# Patient Record
Sex: Female | Born: 1990 | Race: White | Hispanic: Yes | Marital: Single | Smoking: Never smoker
Health system: Southern US, Community
[De-identification: ages and names within clinical notes are randomized; demographics above are authoritative.]

## PROBLEM LIST (undated history)

## (undated) DIAGNOSIS — M1990 Unspecified osteoarthritis, unspecified site: Secondary | ICD-10-CM

---
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
## 2010-05-06 LAB — BASIC METABOLIC PANEL
CO2: 22 mEq/L (ref 19–32)
Calcium: 8 mg/dL — ABNORMAL LOW (ref 8.4–10.5)
Potassium: 3.8 mEq/L (ref 3.5–5.1)
Sodium: 135 mEq/L (ref 135–145)

## 2010-05-06 LAB — COMPREHENSIVE METABOLIC PANEL
ALT: 23 U/L (ref 0–35)
AST: 39 U/L — ABNORMAL HIGH (ref 0–37)
CO2: 20 mEq/L (ref 19–32)
Chloride: 103 mEq/L (ref 96–112)
Creatinine, Ser: 1.32 mg/dL — ABNORMAL HIGH (ref 0.4–1.2)
GFR calc Af Amer: >60 mL/min (ref >60)
GFR calc non Af Amer: 52 mL/min — ABNORMAL LOW (ref >60)
Total Bilirubin: 0.8 mg/dL (ref 0.3–1.2)

## 2010-05-06 LAB — MAGNESIUM: Magnesium: 1.8 mg/dL (ref 1.5–2.5)

## 2010-05-06 LAB — DIFFERENTIAL
Basophils Relative: 0 % (ref 0–1)
Eosinophils Absolute: 0 10*3/uL (ref 0.0–0.7)
Neutrophils Relative %: 70 % (ref 43–77)

## 2010-05-06 LAB — T4, FREE: Free T4: 1.06 ng/dL (ref 0.80–1.80)

## 2010-05-06 LAB — URINALYSIS, ROUTINE W REFLEX MICROSCOPIC
Glucose, UA: NEGATIVE mg/dL
Protein, ur: 100 mg/dL — AB
Specific Gravity, Urine: 1.025 (ref 1.005–1.030)
Urobilinogen, UA: 0.2 mg/dL (ref 0.0–1.0)
pH: 5.5 (ref 5.0–8.0)

## 2010-05-06 LAB — URINE CULTURE: Special Requests: NEGATIVE

## 2010-05-06 LAB — CBC
Hemoglobin: 12.5 g/dL (ref 12.0–15.0)
MCHC: 33.4 g/dL (ref 30.0–36.0)
Platelets: 178 10*3/uL (ref 150–400)
RBC: 3.63 MIL/uL — ABNORMAL LOW (ref 3.87–5.11)
RDW: 14.5 % (ref 11.5–15.5)
RDW: 15.2 % (ref 11.5–15.5)

## 2010-05-06 LAB — RAPID URINE DRUG SCREEN, HOSP PERFORMED
Amphetamines: NOT DETECTED
Opiates: NOT DETECTED

## 2010-05-06 LAB — URINE MICROSCOPIC-ADD ON

## 2010-05-06 LAB — HEMOGLOBIN A1C
Hgb A1c MFr Bld: 5.8 % (ref 4.6–6.1)
Mean Plasma Glucose: 120 mg/dL

## 2010-05-06 LAB — TROPONIN I
Troponin I: 0.08 ng/mL — ABNORMAL HIGH (ref 0.00–0.06)
Troponin I: 0.43 ng/mL — ABNORMAL HIGH (ref 0.00–0.06)

## 2010-05-06 LAB — CULTURE, BLOOD (ROUTINE X 2): Culture: NO GROWTH

## 2010-05-06 LAB — TSH: TSH: 4.687 u[IU]/mL — ABNORMAL HIGH (ref 0.700–6.400)

## 2010-05-06 LAB — CK TOTAL AND CKMB (NOT AT ARMC)
CK, MB: 0.9 ng/mL (ref 0.3–4.0)
Relative Index: INVALID (ref 0.0–2.5)

## 2010-05-06 LAB — LACTIC ACID, PLASMA: Lactic Acid, Venous: 1.3 mmol/L (ref 0.5–2.2)

## 2010-05-06 LAB — PROTIME-INR: Prothrombin Time: 15.6 seconds — ABNORMAL HIGH (ref 11.6–15.2)

## 2010-05-09 LAB — CBC
Hemoglobin: 10.4 g/dL — ABNORMAL LOW (ref 12.0–15.0)
RBC: 3.68 MIL/uL — ABNORMAL LOW (ref 3.87–5.11)
WBC: 6.4 10*3/uL (ref 4.0–10.5)

## 2010-05-09 LAB — BASIC METABOLIC PANEL
Calcium: 8.3 mg/dL — ABNORMAL LOW (ref 8.4–10.5)
Creatinine, Ser: 0.49 mg/dL (ref 0.4–1.2)
GFR calc Af Amer: >60 mL/min (ref >60)
Sodium: 135 mEq/L (ref 135–145)

## 2010-06-19 IMAGING — CR DG CHEST 1V PORT
1 series · 1 of 1 positions shown · non-contrast
Comparison: Portable exam 8688 hours without priors for comparison.

CLINICAL DATA: Arrhythmia, fever

PORTABLE CHEST - 1 VIEW

[view not recorded]
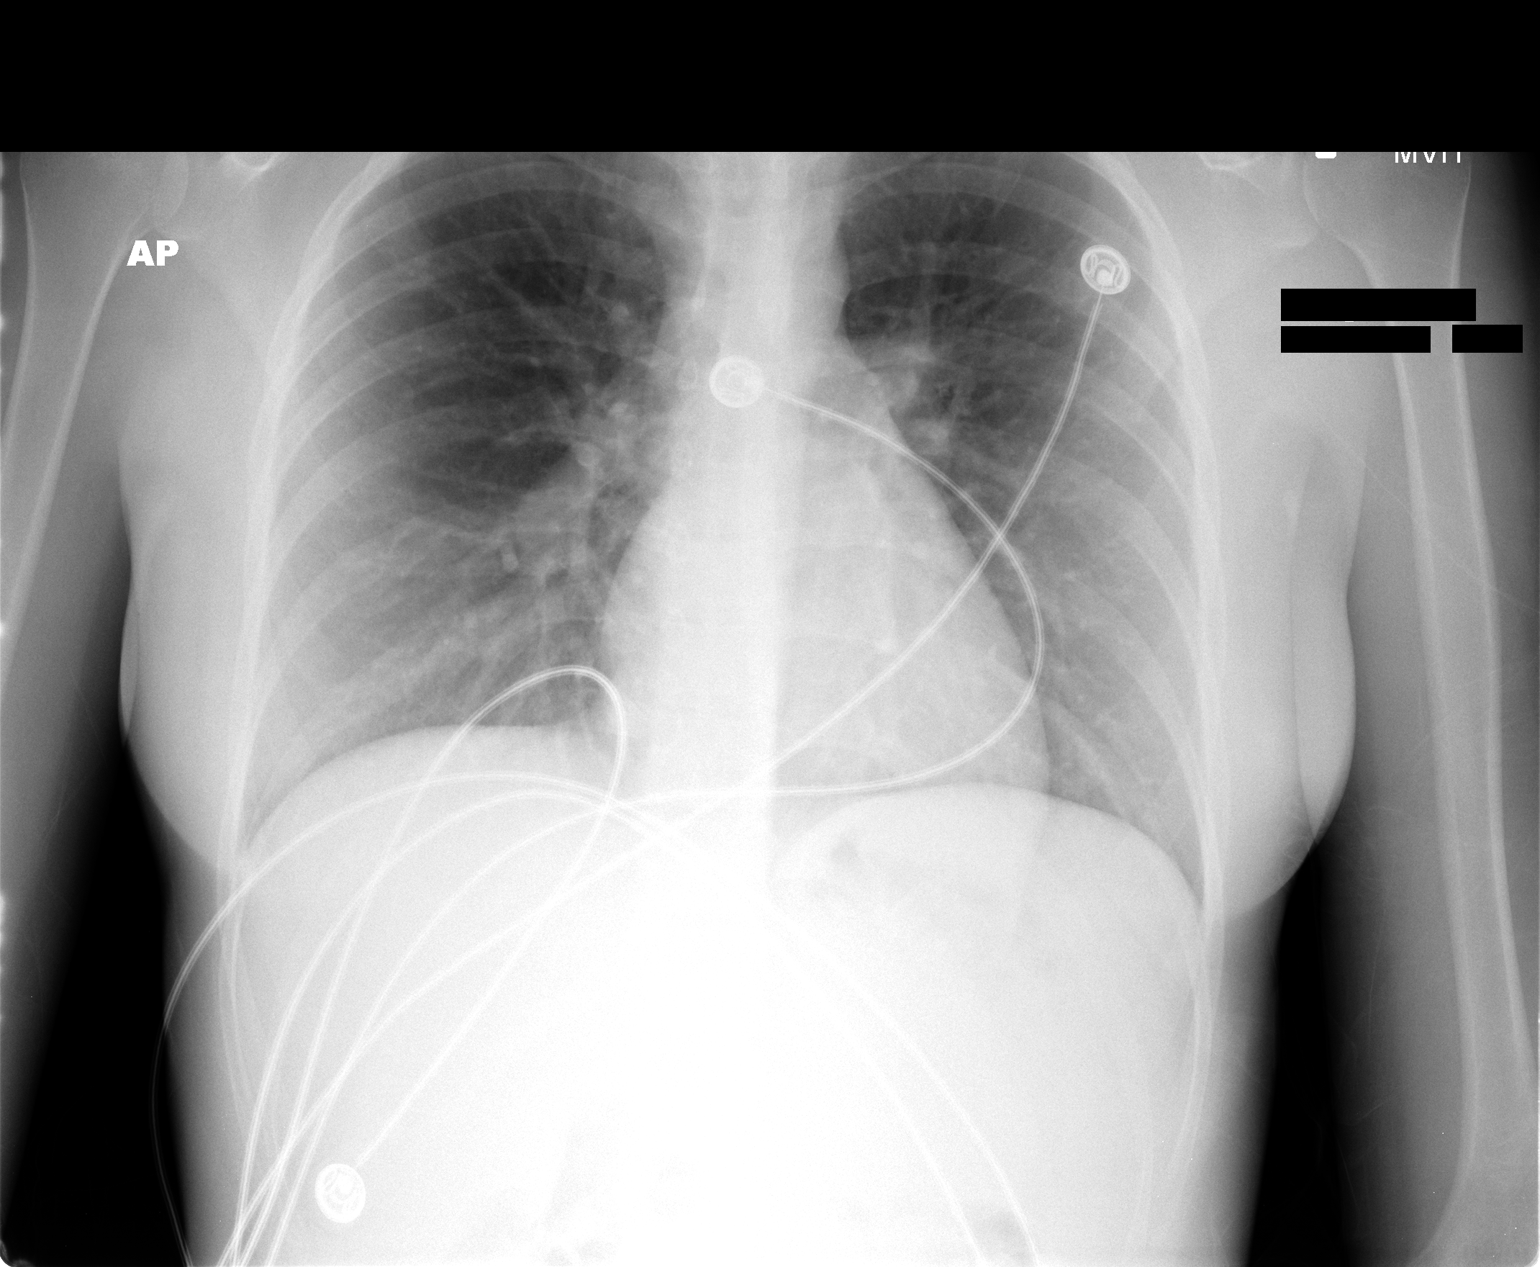

[1 of 1 positions shown; findings below may reference images not displayed]

FINDINGS: Normal heart size, mediastinal contours, and pulmonary vascularity
for technique.
Lungs clear.
Cardiac monitoring lines project over chest.
No pleural effusion or pneumothorax.
IMPRESSION: No acute abnormalities.

## 2010-11-23 ENCOUNTER — Emergency Department (HOSPITAL_COMMUNITY)
Admission: EM | Admit: 2010-11-23 | Discharge: 2010-11-23 | Discharge status: Against Medical Advice | Payer: BC Managed Care – PPO | Attending: Emergency Medicine | Admitting: Emergency Medicine

## 2010-11-23 DIAGNOSIS — Z0389 Encounter for observation for other suspected diseases and conditions ruled out: Secondary | ICD-10-CM | POA: Insufficient documentation

## 2013-07-06 ENCOUNTER — Emergency Department (HOSPITAL_COMMUNITY)
Admission: EM | Admit: 2013-07-06 | Discharge: 2013-07-06 | Disposition: A | Discharge status: Home / Self Care | Payer: BC Managed Care – PPO | Source: Home / Self Care

## 2013-07-06 ENCOUNTER — Encounter (HOSPITAL_COMMUNITY): Payer: Self-pay | Admitting: Emergency Medicine

## 2013-07-06 DIAGNOSIS — R5381 Other malaise: Secondary | ICD-10-CM

## 2013-07-06 DIAGNOSIS — R5383 Other fatigue: Secondary | ICD-10-CM

## 2013-07-06 DIAGNOSIS — K589 Irritable bowel syndrome without diarrhea: Secondary | ICD-10-CM

## 2013-07-06 HISTORY — DX: Unspecified osteoarthritis, unspecified site: M19.90

## 2013-07-06 LAB — POCT URINALYSIS DIP (DEVICE)
Bilirubin Urine: NEGATIVE
Glucose, UA: NEGATIVE mg/dL
Hgb urine dipstick: NEGATIVE
Ketones, ur: NEGATIVE mg/dL
LEUKOCYTES UA: NEGATIVE
NITRITE: NEGATIVE
PH: 7.5 (ref 5.0–8.0)
PROTEIN: NEGATIVE mg/dL
Specific Gravity, Urine: 1.025 (ref 1.005–1.030)
UROBILINOGEN UA: 0.2 mg/dL (ref 0.0–1.0)

## 2013-07-06 NOTE — Discharge Instructions (Signed)
Diet and Irritable Bowel Syndrome  °No cure has been found for irritable bowel syndrome (IBS). Many options are available to treat the symptoms. Your caregiver will give you the best treatments available for your symptoms. He or she will also encourage you to manage stress and to make changes to your diet. You need to work with your caregiver and Registered Dietician to find the best combination of medicine, diet, counseling, and support to control your symptoms. The following are some diet suggestions. °FOODS THAT MAKE IBS WORSE °· Fatty foods, such as French fries. °· Milk products, such as cheese or ice cream. °· Chocolate. °· Alcohol. °· Caffeine (found in coffee and some sodas). °· Carbonated drinks, such as soda. °If certain foods cause symptoms, you should eat less of them or stop eating them. °FOOD JOURNAL  °· Keep a journal of the foods that seem to cause distress. Write down: °· What you are eating during the day and when. °· What problems you are having after eating. °· When the symptoms occur in relation to your meals. °· What foods always make you feel badly. °· Take your notes with you to your caregiver to see if you should stop eating certain foods. °FOODS THAT MAKE IBS BETTER °Fiber reduces IBS symptoms, especially constipation, because it makes stools soft, bulky, and easier to pass. Fiber is found in bran, bread, cereal, beans, fruit, and vegetables. Examples of foods with fiber include: °· Apples. °· Peaches. °· Pears. °· Berries. °· Figs. °· Broccoli, raw. °· Cabbage. °· Carrots. °· Raw peas. °· Kidney beans. °· Lima beans. °· Whole-grain bread. °· Whole-grain cereal. °Add foods with fiber to your diet a little at a time. This will let your body get used to them. Too much fiber at once might cause gas and swelling of your abdomen. This can trigger symptoms in a person with IBS. Caregivers usually recommend a diet with enough fiber to produce soft, painless bowel movements. High fiber diets may  cause gas and bloating. However, these symptoms often go away within a few weeks, as your body adjusts. °In many cases, dietary fiber may lessen IBS symptoms, particularly constipation. However, it may not help pain or diarrhea. High fiber diets keep the colon mildly enlarged (distended) with the added fiber. This may help prevent spasms in the colon. Some forms of fiber also keep water in the stool, thereby preventing hard stools that are difficult to pass.  °Besides telling you to eat more foods with fiber, your caregiver may also tell you to get more fiber by taking a fiber pill or drinking water mixed with a special high fiber powder. An example of this is a natural fiber laxative containing psyllium seed.  °TIPS °· Large meals can cause cramping and diarrhea in people with IBS. If this happens to you, try eating 4 or 5 small meals a day, or try eating less at each of your usual 3 meals. It may also help if your meals are low in fat and high in carbohydrates. Examples of carbohydrates are pasta, rice, whole-grain breads and cereals, fruits, and vegetables. °· If dairy products cause your symptoms to flare up, you can try eating less of those foods. You might be able to handle yogurt better than other dairy products, because it contains bacteria that helps with digestion. Dairy products are an important source of calcium and other nutrients. If you need to avoid dairy products, be sure to talk with a Registered Dietitian about getting these nutrients   through other food sources.  Drink enough water and fluids to keep your urine clear or pale yellow. This is important, especially if you have diarrhea. FOR MORE INFORMATION  International Foundation for Functional Gastrointestinal Disorders: www.iffgd.org  National Digestive Diseases Information Clearinghouse: digestive.StageSync.si Document Released: 04/26/2003 Document Revised: 04/28/2011 Document Reviewed: 01/11/2007 [facility] Patient Information 2014  [place], [place].  Bloating Bloating is the feeling of fullness in your belly. You may feel as though your pants are too tight. Often the cause of bloating is overeating, retaining fluids, or having gas in your bowel. It is also caused by swallowing air and eating foods that cause gas. Irritable bowel syndrome is one of the most common causes of bloating. Constipation is also a common cause. Sometimes more serious problems can cause bloating. SYMPTOMS  Usually there is a feeling of fullness, as though your abdomen is bulged out. There may be mild discomfort.  DIAGNOSIS  Usually no particular testing is necessary for most bloating. If the condition persists and seems to become worse, your caregiver may do additional testing.  TREATMENT   There is no direct treatment for bloating.  Do not put gas into the bowel. Avoid chewing gum and sucking on candy. These tend to make you swallow air. Swallowing air can also be a nervous habit. Try to avoid this.  Avoiding high residue diets will help. Eat foods with soluble fibers (examples include root vegetables, apples, or barley) and substitute dairy products with soy and rice products. This helps irritable bowel syndrome.  If constipation is the cause, then a high residue diet with more fiber will help.  Avoid carbonated beverages.  Over-the-counter preparations are available that help reduce gas. Your pharmacist can help you with this. SEEK MEDICAL CARE IF:   Bloating continues and seems to be getting worse.  You notice a weight gain.  You have a weight loss but the bloating is getting worse.  You have changes in your bowel habits or develop nausea or vomiting. SEEK IMMEDIATE MEDICAL CARE IF:   You develop shortness of breath or swelling in your legs.  You have an increase in abdominal pain or develop chest pain. Document Released: 12/04/2005 Document Revised: 04/28/2011 Document Reviewed: 01/22/2007 [facility] Patient Information 2014  [place], [place].  Irritable Bowel Syndrome Irritable Bowel Syndrome (IBS) is caused by a disturbance of normal bowel function. Other terms used are spastic colon, mucous colitis, and irritable colon. It does not require surgery, nor does it lead to cancer. There is no cure for IBS. But with proper diet, stress reduction, and medication, you will find that your problems (symptoms) will gradually disappear or improve. IBS is a common digestive disorder. It usually appears in late adolescence or early adulthood. Women develop it twice as often as men. CAUSES  After food has been digested and absorbed in the small intestine, waste material is moved into the colon (large intestine). In the colon, water and salts are absorbed from the undigested products coming from the small intestine. The remaining residue, or fecal material, is held for elimination. Under normal circumstances, gentle, rhythmic contractions on the bowel walls push the fecal material along the colon towards the rectum. In IBS, however, these contractions are irregular and poorly coordinated. The fecal material is either retained too long, resulting in constipation, or expelled too soon, producing diarrhea. SYMPTOMS  The most common symptom of IBS is pain. It is typically in the lower left side of the belly (abdomen). But it may occur anywhere in the abdomen.  It can be felt as heartburn, backache, or even as a dull pain in the arms or shoulders. The pain comes from excessive bowel-muscle spasms and from the buildup of gas and fecal material in the colon. This pain:  Can range from sharp belly (abdominal) cramps to a dull, continuous ache.  Usually worsens soon after eating.  Is typically relieved by having a bowel movement or passing gas. Abdominal pain is usually accompanied by constipation. But it may also produce diarrhea. The diarrhea typically occurs right after a meal or upon arising in the morning. The stools are typically soft and  watery. They are often flecked with secretions (mucus). Other symptoms of IBS include:  Bloating.  Loss of appetite.  Heartburn.  Feeling sick to your stomach (nausea).  Belching  Vomiting  Gas. IBS may also cause a number of symptoms that are unrelated to the digestive system:  Fatigue.  Headaches.  Anxiety  Shortness of breath  Difficulty in concentrating.  Dizziness. These symptoms tend to come and go. DIAGNOSIS  The symptoms of IBS closely mimic the symptoms of other, more serious digestive disorders. So your caregiver may wish to perform a variety of additional tests to exclude these disorders. He/she wants to be certain of learning what is wrong (diagnosis). The nature and purpose of each test will be explained to you. TREATMENT A number of medications are available to help correct bowel function and/or relieve bowel spasms and abdominal pain. Among the drugs available are:  Mild, non-irritating laxatives for severe constipation and to help restore normal bowel habits.  Specific anti-diarrheal medications to treat severe or prolonged diarrhea.  Anti-spasmodic agents to relieve intestinal cramps.  Your caregiver may also decide to treat you with a mild tranquilizer or sedative during unusually stressful periods in your life. The important thing to remember is that if any drug is prescribed for you, make sure that you take it exactly as directed. Make sure that your caregiver knows how well it worked for you. HOME CARE INSTRUCTIONS   Avoid foods that are high in fat or oils. Some examples WUJ:WJXBJare:heavy cream, butter, frankfurters, sausage, and other fatty meats.  Avoid foods that have a laxative effect, such as fruit, fruit juice, and dairy products.  Cut out carbonated drinks, chewing gum, and "gassy" foods, such as beans and cabbage. This may help relieve bloating and belching.  Bran taken with plenty of liquids may help relieve constipation.  Keep track of what  foods seem to trigger your symptoms.  Avoid emotionally charged situations or circumstances that produce anxiety.  Start or continue exercising.  Get plenty of rest and sleep. MAKE SURE YOU:   Understand these instructions.  Will watch your condition.  Will get help right away if you are not doing well or get worse. Document Released: 02/03/2005 Document Revised: 04/28/2011 Document Reviewed: 09/24/2007 [facility] Patient Information 2014 [place].  Fatigue Fatigue is a feeling of tiredness, lack of energy, lack of motivation, or feeling tired all the time. Having enough rest, good nutrition, and reducing stress will normally reduce fatigue. Consult your caregiver if it persists. The nature of your fatigue will help your caregiver to find out its cause. The treatment is based on the cause.  CAUSES  There are many causes for fatigue. Most of the time, fatigue can be traced to one or more of your habits or routines. Most causes fit into one or more of three general areas. They are: Lifestyle problems  Sleep disturbances.  Overwork.  Physical exertion.  Unhealthy habits.  Poor eating habits or eating disorders.  Alcohol and/or drug use .  Lack of proper nutrition (malnutrition). Psychological problems  Stress and/or anxiety problems.  Depression.  Grief.  Boredom. Medical Problems or Conditions  Anemia.  Pregnancy.  Thyroid gland problems.  Recovery from major surgery.  Continuous pain.  Emphysema or asthma that is not well controlled  Allergic conditions.  Diabetes.  Infections (such as mononucleosis).  Obesity.  Sleep disorders, such as sleep apnea.  Heart failure or other heart-related problems.  Cancer.  Kidney disease.  Liver disease.  Effects of certain medicines such as antihistamines, cough and cold remedies, prescription pain medicines, heart and blood pressure medicines, drugs used for treatment of cancer, and some  antidepressants. SYMPTOMS  The symptoms of fatigue include:   Lack of energy.  Lack of drive (motivation).  Drowsiness.  Feeling of indifference to the surroundings. DIAGNOSIS  The details of how you feel help guide your caregiver in finding out what is causing the fatigue. You will be asked about your present and past health condition. It is important to review all medicines that you take, including prescription and non-prescription items. A thorough exam will be done. You will be questioned about your feelings, habits, and normal lifestyle. Your caregiver may suggest blood tests, urine tests, or other tests to look for common medical causes of fatigue.  TREATMENT  Fatigue is treated by correcting the underlying cause. For example, if you have continuous pain or depression, treating these causes will improve how you feel. Similarly, adjusting the dose of certain medicines will help in reducing fatigue.  HOME CARE INSTRUCTIONS   Try to get the required amount of good sleep every night.  Eat a healthy and nutritious diet, and drink enough water throughout the day.  Practice ways of relaxing (including yoga or meditation).  Exercise regularly.  Make plans to change situations that cause stress. Act on those plans so that stresses decrease over time. Keep your work and personal routine reasonable.  Avoid street drugs and minimize use of alcohol.  Start taking a daily multivitamin after consulting your caregiver. SEEK MEDICAL CARE IF:   You have persistent tiredness, which cannot be accounted for.  You have fever.  You have unintentional weight loss.  You have headaches.  You have disturbed sleep throughout the night.  You are feeling sad.  You have constipation.  You have dry skin.  You have gained weight.  You are taking any new or different medicines that you suspect are causing fatigue.  You are unable to sleep at night.  You develop any unusual swelling of your  legs or other parts of your body. SEEK IMMEDIATE MEDICAL CARE IF:   You are feeling confused.  Your vision is blurred.  You feel faint or pass out.  You develop severe headache.  You develop severe abdominal, pelvic, or back pain.  You develop chest pain, shortness of breath, or an irregular or fast heartbeat.  You are unable to pass a normal amount of urine.  You develop abnormal bleeding such as bleeding from the rectum or you vomit blood.  You have thoughts about harming yourself or committing suicide.  You are worried that you might harm someone else. MAKE SURE YOU:   Understand these instructions.  Will watch your condition.  Will get help right away if you are not doing well or get worse. Document Released: 12/01/2006 Document Revised: 04/28/2011 Document Reviewed: 12/01/2006 [employer] Patient Information 2014 [place]. ° °

## 2013-07-06 NOTE — ED Provider Notes (Signed)
CSN: [id number]     Arrival date & time 07/06/13  [id number] History   First MD Initiated Contact with Patient 07/06/13 1054     Chief Complaint  Patient presents with  . Constipation   (Consider location/radiation/quality/duration/timing/severity/associated sxs/prior Treatment) HPI Comments: 23 year old female presents with GI issues for which she was told was due to IBS. The symptoms have been occurring for over a year. She is having constipation alternating with diarrhea. Diarrhea usually occurs after eating and she never feels as though she empties her bowel completely. He is also complaining of bloating and swelling. Associated symptoms include tiredness, weakness and nonspecific headache. She realizes that we have limited resources here but was hoping for a battery of tests not available to us. She is also hoping for a referral.    Past Medical History  Diagnosis Date  . Arthritis    History reviewed. No pertinent past surgical history. History reviewed. No pertinent family history. History  Substance Use Topics  . Smoking status: Never Smoker   . Smokeless tobacco: Not on file  . Alcohol Use: No   OB History   Grav Para Term Preterm Abortions TAB SAB Ect Mult Living                 Review of Systems  Constitutional: Positive for activity change and fatigue. Negative for fever.  Respiratory: Negative.   Cardiovascular: Negative for chest pain, palpitations and leg swelling.  Gastrointestinal: Positive for abdominal pain, diarrhea and constipation. Negative for blood in stool and abdominal distention.  Genitourinary: Positive for frequency. Negative for dysuria, vaginal bleeding, vaginal discharge, menstrual problem and pelvic pain.  Musculoskeletal: Negative.   Neurological: Positive for headaches. Negative for tremors, syncope, speech difficulty and numbness.    Allergies  Review of patient's allergies indicates no known allergies.  Home Medications   Prior to Admission  medications   Not on File   BP 118/82  Pulse 73  Temp(Src) 98.1 F (36.7 C) (Oral)  Resp 16  SpO2 99%  LMP 06/21/2013 Physical Exam  Nursing note and vitals reviewed. Constitutional: She is oriented to person, place, and time. She appears well-developed and well-nourished. No distress.  HENT:  Mouth/Throat: Oropharynx is clear and moist. No oropharyngeal exudate.  Eyes: Conjunctivae and EOM are normal. Pupils are equal, round, and reactive to light.  Neck: Normal range of motion. Neck supple.  Cardiovascular: Normal rate and regular rhythm.   No murmur heard. Pulmonary/Chest: Effort normal and breath sounds normal. No respiratory distress. She has no wheezes. She has no rales.  Abdominal: Soft. Bowel sounds are normal. She exhibits no distension and no mass. There is no rebound and no guarding.  Minor periumbilical tenderness. Mid abd percusses tympanic.  Abd exam otherwise unremarkable. No surgical/acute abd.  Musculoskeletal: Normal range of motion. She exhibits no edema and no tenderness.  Lymphadenopathy:    She has no cervical adenopathy.  Neurological: She is alert and oriented to person, place, and time.  Skin: Skin is warm and dry.  Psychiatric: Her behavior is normal.    ED Course  Procedures (including critical care time) Labs Review Labs Reviewed  POCT URINALYSIS DIP (DEVICE)    Imaging Review No results found. Results for orders placed during the hospital encounter of 07/06/13  POCT URINALYSIS DIP (DEVICE)      Result Value Ref Range   Glucose, UA NEGATIVE  NEGATIVE mg/dL   Bilirubin Urine NEGATIVE  NEGATIVE   Ketones, ur NEGATIVE  NEGATIVE mg/dL  Specific Gravity, Urine 1.025  1.005 - 1.030   Hgb urine dipstick NEGATIVE  NEGATIVE   pH 7.5  5.0 - 8.0   Protein, ur NEGATIVE  NEGATIVE mg/dL   Urobilinogen, UA 0.2  0.0 - 1.0 mg/dL   Nitrite NEGATIVE  NEGATIVE   Leukocytes, UA NEGATIVE  NEGATIVE     MDM   1. IBS (irritable bowel syndrome)   2.  Fatigue     Pt is leaving the area in 2 weeks to live with her parents. We discussed the need for PCP and GI but the likelihood of an appt before 2 weeks is low. No distress now.  Lots of fluids, fiber.  Before medical tx will need colonoscopy.    Hayden Rasmussenavid Jazzmyne Rasnick, NP 07/06/13 [phone]

## 2013-07-06 NOTE — ED Notes (Signed)
Pt  Reports  Symptoms  Of    Of  Bowel       Problems  To  Include  Bloating  /  Constipation    /  Back  Pain       With  Symptoms  X  6   Months    She  Also  Reports  Headache   X  sev  Weeks

## 2013-07-07 NOTE — ED Provider Notes (Signed)
Medical screening examination/treatment/procedure(s) were performed by a resident physician or non-physician practitioner and as the supervising physician I was immediately available for consultation/collaboration.  Kimesha Claxton, MD    Berklee Battey S Yashua Bracco, MD 07/07/13 0752
